# Patient Record
Sex: Male | Born: 1965 | Race: Black or African American | Hispanic: No | Marital: Married | Smoking: Never smoker
Health system: Southern US, Community
[De-identification: ages and names within clinical notes are randomized; demographics above are authoritative.]

---

## 2016-02-19 ENCOUNTER — Emergency Department (HOSPITAL_COMMUNITY): Payer: No Typology Code available for payment source

## 2016-02-19 ENCOUNTER — Emergency Department (HOSPITAL_COMMUNITY)
Admission: EM | Admit: 2016-02-19 | Discharge: 2016-02-19 | Disposition: A | Discharge status: Home / Self Care | Payer: No Typology Code available for payment source | Attending: Emergency Medicine | Admitting: Emergency Medicine

## 2016-02-19 ENCOUNTER — Encounter (HOSPITAL_COMMUNITY): Payer: Self-pay

## 2016-02-19 DIAGNOSIS — Y92410 Unspecified street and highway as the place of occurrence of the external cause: Secondary | ICD-10-CM | POA: Diagnosis not present

## 2016-02-19 DIAGNOSIS — M25551 Pain in right hip: Secondary | ICD-10-CM | POA: Diagnosis not present

## 2016-02-19 DIAGNOSIS — Y939 Activity, unspecified: Secondary | ICD-10-CM | POA: Insufficient documentation

## 2016-02-19 DIAGNOSIS — M542 Cervicalgia: Secondary | ICD-10-CM | POA: Diagnosis not present

## 2016-02-19 DIAGNOSIS — Y999 Unspecified external cause status: Secondary | ICD-10-CM | POA: Insufficient documentation

## 2016-02-19 DIAGNOSIS — M545 Low back pain: Secondary | ICD-10-CM | POA: Insufficient documentation

## 2016-02-19 DIAGNOSIS — M25552 Pain in left hip: Secondary | ICD-10-CM | POA: Insufficient documentation

## 2016-02-19 DIAGNOSIS — R52 Pain, unspecified: Secondary | ICD-10-CM

## 2016-02-19 MED ORDER — CYCLOBENZAPRINE HCL 5 MG PO TABS: 5.0000 mg | ORAL_TABLET | Freq: Three times a day (TID) | ORAL | 0 refills | Status: AC | PRN

## 2016-02-19 MED ORDER — CYCLOBENZAPRINE HCL 10 MG PO TABS
5.0000 mg | ORAL_TABLET | Freq: Once | ORAL | Status: AC
  Administered 2016-02-19: 5 mg via ORAL
  Filled 2016-02-19: qty 1

## 2016-02-19 MED ORDER — NAPROXEN 500 MG PO TABS
500.0000 mg | ORAL_TABLET | Freq: Two times a day (BID) | ORAL | 0 refills | Status: AC
Start: 2016-02-19 — End: ?

## 2016-02-19 MED ORDER — HYDROCODONE-ACETAMINOPHEN 5-325 MG PO TABS
1.0000 | ORAL_TABLET | Freq: Once | ORAL | Status: AC
  Administered 2016-02-19: 1 via ORAL
  Filled 2016-02-19: qty 1

## 2016-02-19 MED ORDER — KETOROLAC TROMETHAMINE 30 MG/ML IJ SOLN
30.0000 mg | Freq: Once | INTRAMUSCULAR | Status: AC
  Administered 2016-02-19: 30 mg via INTRAMUSCULAR
  Filled 2016-02-19: qty 1

## 2016-02-19 NOTE — ED Notes (Signed)
Bed: WA03 Expected date:  Expected time:  Means of arrival:  Comments: 
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
## 2016-02-19 NOTE — Discharge Instructions (Signed)
Read the information below.  Your x-rays did not show any acute abnormalities.  You may feel sore for the next 2-3 days. I have prescribed naprosyn and flexeril for relief. While taking naprosyn do not take other NSAIDs (ibuprofen, motrin, or aleve). Flexeril can make you drowsy, do not drive after taking.  You can apply heat/ice to affected areas for 20 minute increments.  Warm showers can soothe sore muscles.  If symptoms persist for more than a week follow up with your primary provider.  Use the prescribed medication as directed.  Please discuss all new medications with your pharmacist.   You may return to the Emergency Department at any time for worsening condition or any new symptoms that concern you.
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
